# Patient Record
Sex: Female | Born: 1965 | Race: White | Hispanic: No | Marital: Single | Smoking: Never smoker
Health system: Southern US, Community
[De-identification: ages and names within clinical notes are randomized; demographics above are authoritative.]

## PROBLEM LIST (undated history)

## (undated) HISTORY — PX: NASAL SINUS SURGERY: SHX719

## (undated) HISTORY — PX: ENDOMETRIAL ABLATION: SHX621

---

## 1997-08-21 ENCOUNTER — Other Ambulatory Visit: Admission: RE | Admit: 1997-08-21 | Discharge: 1997-08-21 | Payer: Self-pay | Admitting: Obstetrics and Gynecology

## 1997-10-15 ENCOUNTER — Ambulatory Visit (HOSPITAL_COMMUNITY): Admission: RE | Admit: 1997-10-15 | Discharge: 1997-10-15 | Payer: Self-pay | Admitting: Obstetrics and Gynecology

## 1999-03-10 ENCOUNTER — Other Ambulatory Visit: Admission: RE | Admit: 1999-03-10 | Discharge: 1999-03-10 | Payer: Self-pay | Admitting: *Deleted

## 1999-03-11 ENCOUNTER — Encounter (INDEPENDENT_AMBULATORY_CARE_PROVIDER_SITE_OTHER): Payer: Self-pay | Admitting: Specialist

## 1999-03-11 ENCOUNTER — Other Ambulatory Visit: Admission: RE | Admit: 1999-03-11 | Discharge: 1999-03-11 | Payer: Self-pay | Admitting: *Deleted

## 2000-03-09 ENCOUNTER — Other Ambulatory Visit: Admission: RE | Admit: 2000-03-09 | Discharge: 2000-03-09 | Payer: Self-pay | Admitting: Obstetrics and Gynecology

## 2000-05-10 ENCOUNTER — Encounter: Admission: RE | Admit: 2000-05-10 | Discharge: 2000-05-10 | Payer: Self-pay | Admitting: Obstetrics and Gynecology

## 2000-05-10 ENCOUNTER — Encounter: Payer: Self-pay | Admitting: Obstetrics and Gynecology

## 2001-04-19 ENCOUNTER — Other Ambulatory Visit: Admission: RE | Admit: 2001-04-19 | Discharge: 2001-04-19 | Payer: Self-pay | Admitting: Obstetrics and Gynecology

## 2002-04-26 ENCOUNTER — Other Ambulatory Visit: Admission: RE | Admit: 2002-04-26 | Discharge: 2002-04-26 | Payer: Self-pay | Admitting: Obstetrics and Gynecology

## 2003-05-21 ENCOUNTER — Other Ambulatory Visit: Admission: RE | Admit: 2003-05-21 | Discharge: 2003-05-21 | Payer: Self-pay | Admitting: Obstetrics and Gynecology

## 2005-10-20 ENCOUNTER — Encounter: Admission: RE | Admit: 2005-10-20 | Discharge: 2005-10-20 | Payer: Self-pay | Admitting: Obstetrics and Gynecology

## 2009-06-25 ENCOUNTER — Ambulatory Visit: Payer: Self-pay | Admitting: Internal Medicine

## 2010-02-21 ENCOUNTER — Ambulatory Visit (HOSPITAL_COMMUNITY): Admission: RE | Admit: 2010-02-21 | Discharge: 2010-02-21 | Payer: Self-pay | Admitting: Obstetrics and Gynecology

## 2010-09-02 ENCOUNTER — Ambulatory Visit: Payer: Self-pay | Admitting: Family Medicine

## 2010-09-26 LAB — CBC
HCT: 34.4 % — ABNORMAL LOW (ref 36.0–46.0)
Hemoglobin: 11.3 g/dL — ABNORMAL LOW (ref 12.0–15.0)
MCH: 23.3 pg — ABNORMAL LOW (ref 26.0–34.0)
MCHC: 32.7 g/dL (ref 30.0–36.0)
MCV: 71.3 fL — ABNORMAL LOW (ref 78.0–100.0)
Platelets: 338 10*3/uL (ref 150–400)
RBC: 4.83 MIL/uL (ref 3.87–5.11)
RDW: 15.3 % (ref 11.5–15.5)
WBC: 10.1 10*3/uL (ref 4.0–10.5)

## 2010-09-26 LAB — HCG, SERUM, QUALITATIVE: Preg, Serum: NEGATIVE

## 2011-08-19 ENCOUNTER — Ambulatory Visit: Payer: Self-pay | Admitting: Otolaryngology

## 2012-04-19 ENCOUNTER — Other Ambulatory Visit: Payer: Self-pay | Admitting: Obstetrics and Gynecology

## 2012-04-23 ENCOUNTER — Encounter (HOSPITAL_COMMUNITY): Payer: Self-pay | Admitting: Pharmacist

## 2012-04-25 ENCOUNTER — Encounter (HOSPITAL_COMMUNITY): Payer: Self-pay | Admitting: *Deleted

## 2012-04-27 NOTE — H&P (Signed)
[name], [name]             ACCOUNT NO.:  [id number]  MEDICAL RECORD NO.:  [id number]  LOCATION:  PERIO                         FACILITY:  [facility]  PHYSICIAN:  Lenoard Aden, M.D.DATE OF BIRTH:  [birth year]  DATE OF ADMISSION:  04/19/2012 DATE OF DISCHARGE:                             HISTORY & PHYSICAL   CHIEF COMPLAINT:  Symptomatic menorrhagia with a history of failed NovaSure.  HISTORY OF PRESENT ILLNESS:  She is a 46 year old white female, G3, P2, with a history of Adiana and NovaSure placed in 2011 who now presents with persistent menorrhagia and pelvic pain for definitive therapy.  MEDICATIONS:  Vicodin prn for pain and Micronor for bleeding.  SOCIAL HISTORY:  She is a nonsmoker, nondrinker.  She denies domestic or physical violence.  SURGICAL HISTORY:  Remarkable for Adiana in 2011, NovaSure ablation in 2011, LEEP in 1997, D and C in 1990, tonsillectomy in 1984, and 2 vaginal deliveries.  FAMILY HISTORY:  Breast cancer, ovarian cancer, and chronic hypertension.  PHYSICAL EXAMINATION:  GENERAL:  She is a well-developed, well- nourished, white female in no acute distress. HEENT:  Normal. NECK:  Supple.  Full range of motion. LUNGS:  Clear. HEART:  Regular rhythm. ABDOMEN:  Soft, nontender.  Pelvic exam revealed a bulky anteflexed uterus.  No adnexal masses. EXTREMITIES:  No cords. NEUROLOGIC:  Nonfocal. SKIN:  Intact.  IMPRESSION:  Refractory dysmenorrhea and menorrhagia.  The patient is status post failed NovaSure.  Plans to proceed with da Vinci assisted total laparoscopic hysterectomy, bilateral salpingectomy.  Risks of anesthesia, infection, bleeding, injury to abdominal organs With need to repair was discussed.  Delayed versus immediate complications to include bowel and bladder injury noted.  The patient acknowledges and wishes to proceed.    Lenoard Aden, M.D.    RJT/MEDQ  D:  04/27/2012  T:  04/27/2012  Job:  [id number]

## 2012-04-28 ENCOUNTER — Ambulatory Visit (HOSPITAL_COMMUNITY): Payer: Managed Care, Other (non HMO) | Admitting: Anesthesiology

## 2012-04-28 ENCOUNTER — Encounter (HOSPITAL_COMMUNITY): Payer: Self-pay | Admitting: Anesthesiology

## 2012-04-28 ENCOUNTER — Ambulatory Visit (HOSPITAL_COMMUNITY)
Admission: RE | Admit: 2012-04-28 | Discharge: 2012-04-29 | Disposition: A | Discharge status: Home / Self Care | Payer: Managed Care, Other (non HMO) | Source: Ambulatory Visit | Attending: Obstetrics and Gynecology | Admitting: Obstetrics and Gynecology

## 2012-04-28 ENCOUNTER — Encounter (HOSPITAL_COMMUNITY)
Admission: RE | Disposition: A | Discharge status: Home / Self Care | Payer: Self-pay | Source: Ambulatory Visit | Attending: Obstetrics and Gynecology

## 2012-04-28 DIAGNOSIS — N920 Excessive and frequent menstruation with regular cycle: Secondary | ICD-10-CM | POA: Insufficient documentation

## 2012-04-28 DIAGNOSIS — N803 Endometriosis of pelvic peritoneum, unspecified: Secondary | ICD-10-CM | POA: Insufficient documentation

## 2012-04-28 LAB — CBC
HCT: 38.9 % (ref 36.0–46.0)
MCV: 82.2 fL (ref 78.0–100.0)
Platelets: 339 10*3/uL (ref 150–400)
RBC: 4.73 MIL/uL (ref 3.87–5.11)
RDW: 12.3 % (ref 11.5–15.5)
WBC: 8.6 10*3/uL (ref 4.0–10.5)

## 2012-04-28 LAB — SURGICAL PCR SCREEN: Staphylococcus aureus: POSITIVE — AB

## 2012-04-28 SURGERY — ROBOTIC ASSISTED TOTAL HYSTERECTOMY
Anesthesia: General | Site: Abdomen | Wound class: Clean Contaminated

## 2012-04-28 MED ORDER — SODIUM CHLORIDE 0.9 % IJ SOLN: 9.0000 mL | INTRAMUSCULAR | Status: DC | PRN

## 2012-04-28 MED ORDER — DEXAMETHASONE SODIUM PHOSPHATE 10 MG/ML IJ SOLN
INTRAMUSCULAR | Status: DC | PRN
  Administered 2012-04-28: 10 mg via INTRAVENOUS

## 2012-04-28 MED ORDER — ZOLPIDEM TARTRATE 5 MG PO TABS: 5.0000 mg | ORAL_TABLET | Freq: Every evening | ORAL | Status: DC | PRN

## 2012-04-28 MED ORDER — DEXAMETHASONE SODIUM PHOSPHATE 10 MG/ML IJ SOLN
INTRAMUSCULAR | Status: AC
  Filled 2012-04-28: qty 1

## 2012-04-28 MED ORDER — FENTANYL CITRATE 0.05 MG/ML IJ SOLN
INTRAMUSCULAR | Status: DC | PRN
  Administered 2012-04-28: 100 ug via INTRAVENOUS
  Administered 2012-04-28 (×2): 50 ug via INTRAVENOUS
  Administered 2012-04-28: 100 ug via INTRAVENOUS
  Administered 2012-04-28: 50 ug via INTRAVENOUS

## 2012-04-28 MED ORDER — MIDAZOLAM HCL 2 MG/2ML IJ SOLN
INTRAMUSCULAR | Status: AC
  Filled 2012-04-28: qty 2

## 2012-04-28 MED ORDER — ACETAMINOPHEN 10 MG/ML IV SOLN
INTRAVENOUS | Status: DC | PRN
  Administered 2012-04-28: 1000 mg via INTRAVENOUS

## 2012-04-28 MED ORDER — MUPIROCIN 2 % EX OINT
TOPICAL_OINTMENT | Freq: Two times a day (BID) | CUTANEOUS | Status: DC
  Administered 2012-04-28: 21:00:00 via NASAL

## 2012-04-28 MED ORDER — PHENYLEPHRINE HCL 10 MG/ML IJ SOLN
INTRAMUSCULAR | Status: DC | PRN
  Administered 2012-04-28: 40 ug via INTRAVENOUS

## 2012-04-28 MED ORDER — ONDANSETRON HCL 4 MG/2ML IJ SOLN
INTRAMUSCULAR | Status: AC
  Filled 2012-04-28: qty 2

## 2012-04-28 MED ORDER — ONDANSETRON HCL 4 MG/2ML IJ SOLN
4.0000 mg | Freq: Once | INTRAMUSCULAR | Status: AC | PRN
  Administered 2012-04-28: 4 mg via INTRAVENOUS

## 2012-04-28 MED ORDER — BUPIVACAINE HCL (PF) 0.25 % IJ SOLN
INTRAMUSCULAR | Status: AC
  Filled 2012-04-28: qty 30

## 2012-04-28 MED ORDER — TRAMADOL HCL 50 MG PO TABS
50.0000 mg | ORAL_TABLET | Freq: Four times a day (QID) | ORAL | Status: DC | PRN
  Administered 2012-04-29: 50 mg via ORAL
  Filled 2012-04-28: qty 1

## 2012-04-28 MED ORDER — ROCURONIUM BROMIDE 100 MG/10ML IV SOLN
INTRAVENOUS | Status: DC | PRN
  Administered 2012-04-28 (×2): 10 mg via INTRAVENOUS
  Administered 2012-04-28: 40 mg via INTRAVENOUS

## 2012-04-28 MED ORDER — FENTANYL CITRATE 0.05 MG/ML IJ SOLN
INTRAMUSCULAR | Status: AC
  Filled 2012-04-28: qty 5

## 2012-04-28 MED ORDER — SUCCINYLCHOLINE CHLORIDE 20 MG/ML IJ SOLN: INTRAMUSCULAR | Status: DC | PRN

## 2012-04-28 MED ORDER — NEOSTIGMINE METHYLSULFATE 1 MG/ML IJ SOLN
INTRAMUSCULAR | Status: AC
  Filled 2012-04-28: qty 10

## 2012-04-28 MED ORDER — OXYCODONE-ACETAMINOPHEN 5-325 MG PO TABS
1.0000 | ORAL_TABLET | ORAL | Status: DC | PRN
  Administered 2012-04-29 (×2): 1 via ORAL
  Filled 2012-04-28 (×2): qty 1

## 2012-04-28 MED ORDER — MEPERIDINE HCL 25 MG/ML IJ SOLN: 6.2500 mg | INTRAMUSCULAR | Status: DC | PRN

## 2012-04-28 MED ORDER — ONDANSETRON HCL 4 MG/2ML IJ SOLN
INTRAMUSCULAR | Status: AC
  Administered 2012-04-28: 4 mg via INTRAVENOUS
  Filled 2012-04-28: qty 2

## 2012-04-28 MED ORDER — DIPHENHYDRAMINE HCL 12.5 MG/5ML PO ELIX: 12.5000 mg | ORAL_SOLUTION | Freq: Four times a day (QID) | ORAL | Status: DC | PRN

## 2012-04-28 MED ORDER — LIDOCAINE HCL (CARDIAC) 20 MG/ML IV SOLN
INTRAVENOUS | Status: AC
  Filled 2012-04-28: qty 5

## 2012-04-28 MED ORDER — ROCURONIUM BROMIDE 50 MG/5ML IV SOLN
INTRAVENOUS | Status: AC
  Filled 2012-04-28: qty 2

## 2012-04-28 MED ORDER — PHENYLEPHRINE 40 MCG/ML (10ML) SYRINGE FOR IV PUSH (FOR BLOOD PRESSURE SUPPORT)
PREFILLED_SYRINGE | INTRAVENOUS | Status: AC
  Filled 2012-04-28: qty 5

## 2012-04-28 MED ORDER — DIPHENHYDRAMINE HCL 50 MG/ML IJ SOLN: 12.5000 mg | Freq: Four times a day (QID) | INTRAMUSCULAR | Status: DC | PRN

## 2012-04-28 MED ORDER — ACETAMINOPHEN 10 MG/ML IV SOLN
1000.0000 mg | Freq: Once | INTRAVENOUS | Status: DC | PRN
  Filled 2012-04-28: qty 100

## 2012-04-28 MED ORDER — GLYCOPYRROLATE 0.2 MG/ML IJ SOLN
INTRAMUSCULAR | Status: AC
  Filled 2012-04-28: qty 4

## 2012-04-28 MED ORDER — ONDANSETRON HCL 4 MG/2ML IJ SOLN
INTRAMUSCULAR | Status: DC | PRN
  Administered 2012-04-28: 4 mg via INTRAVENOUS

## 2012-04-28 MED ORDER — ONDANSETRON HCL 4 MG/2ML IJ SOLN: 4.0000 mg | Freq: Four times a day (QID) | INTRAMUSCULAR | Status: DC | PRN

## 2012-04-28 MED ORDER — NALOXONE HCL 0.4 MG/ML IJ SOLN: 0.4000 mg | INTRAMUSCULAR | Status: DC | PRN

## 2012-04-28 MED ORDER — EPHEDRINE SULFATE 50 MG/ML IJ SOLN
INTRAMUSCULAR | Status: DC | PRN
  Administered 2012-04-28: 5 mg via INTRAVENOUS

## 2012-04-28 MED ORDER — HYDROMORPHONE 0.3 MG/ML IV SOLN
INTRAVENOUS | Status: DC
  Administered 2012-04-28: 1.19 mg via INTRAVENOUS
  Administered 2012-04-28: 15:00:00 via INTRAVENOUS
  Administered 2012-04-28: 3.59 mg via INTRAVENOUS
  Filled 2012-04-28: qty 25

## 2012-04-28 MED ORDER — DEXTROSE IN LACTATED RINGERS 5 % IV SOLN
INTRAVENOUS | Status: DC
  Administered 2012-04-28: 21:00:00 via INTRAVENOUS

## 2012-04-28 MED ORDER — CEFAZOLIN SODIUM-DEXTROSE 2-3 GM-% IV SOLR
INTRAVENOUS | Status: AC
  Filled 2012-04-28: qty 50

## 2012-04-28 MED ORDER — SUCCINYLCHOLINE CHLORIDE 20 MG/ML IJ SOLN
INTRAMUSCULAR | Status: AC
  Filled 2012-04-28: qty 10

## 2012-04-28 MED ORDER — PROPOFOL 10 MG/ML IV EMUL
INTRAVENOUS | Status: DC | PRN
  Administered 2012-04-28: 30 mg via INTRAVENOUS
  Administered 2012-04-28: 170 mg via INTRAVENOUS

## 2012-04-28 MED ORDER — MIDAZOLAM HCL 5 MG/5ML IJ SOLN
INTRAMUSCULAR | Status: DC | PRN
  Administered 2012-04-28: 2 mg via INTRAVENOUS

## 2012-04-28 MED ORDER — LIDOCAINE HCL (CARDIAC) 20 MG/ML IV SOLN
INTRAVENOUS | Status: DC | PRN
  Administered 2012-04-28: 60 mg via INTRAVENOUS

## 2012-04-28 MED ORDER — ARTIFICIAL TEARS OP OINT
TOPICAL_OINTMENT | OPHTHALMIC | Status: DC | PRN
  Administered 2012-04-28: 1 via OPHTHALMIC

## 2012-04-28 MED ORDER — GLYCOPYRROLATE 0.2 MG/ML IJ SOLN
INTRAMUSCULAR | Status: DC | PRN
  Administered 2012-04-28: 0.1 mg via INTRAVENOUS
  Administered 2012-04-28: .8 mg via INTRAVENOUS

## 2012-04-28 MED ORDER — PROPOFOL 10 MG/ML IV EMUL
INTRAVENOUS | Status: AC
  Filled 2012-04-28: qty 20

## 2012-04-28 MED ORDER — NEOSTIGMINE METHYLSULFATE 1 MG/ML IJ SOLN
INTRAMUSCULAR | Status: DC | PRN
  Administered 2012-04-28: 5 mg via INTRAVENOUS

## 2012-04-28 MED ORDER — FENTANYL CITRATE 0.05 MG/ML IJ SOLN
INTRAMUSCULAR | Status: AC
  Filled 2012-04-28: qty 2

## 2012-04-28 MED ORDER — FENTANYL CITRATE 0.05 MG/ML IJ SOLN
25.0000 ug | INTRAMUSCULAR | Status: DC | PRN
  Administered 2012-04-28: 25 ug via INTRAVENOUS
  Administered 2012-04-28: 50 ug via INTRAVENOUS

## 2012-04-28 MED ORDER — ACETAMINOPHEN 10 MG/ML IV SOLN: 1000.0000 mg | Freq: Four times a day (QID) | INTRAVENOUS | Status: DC

## 2012-04-28 MED ORDER — CEFAZOLIN SODIUM-DEXTROSE 2-3 GM-% IV SOLR
2.0000 g | INTRAVENOUS | Status: AC
  Administered 2012-04-28: 2 g via INTRAVENOUS

## 2012-04-28 MED ORDER — LIDOCAINE HCL (CARDIAC) 20 MG/ML IV SOLN: INTRAVENOUS | Status: DC | PRN

## 2012-04-28 MED ORDER — LACTATED RINGERS IV SOLN
INTRAVENOUS | Status: DC
  Administered 2012-04-28 (×2): via INTRAVENOUS

## 2012-04-28 MED ORDER — BUPIVACAINE HCL (PF) 0.25 % IJ SOLN
INTRAMUSCULAR | Status: DC | PRN
  Administered 2012-04-28: 30 mL

## 2012-04-28 MED ORDER — SCOPOLAMINE 1 MG/3DAYS TD PT72
1.0000 | MEDICATED_PATCH | TRANSDERMAL | Status: DC
  Administered 2012-04-28: 1.5 mg via TRANSDERMAL

## 2012-04-28 MED ORDER — FENTANYL CITRATE 0.05 MG/ML IJ SOLN
INTRAMUSCULAR | Status: AC
  Administered 2012-04-28: 25 ug via INTRAVENOUS
  Filled 2012-04-28: qty 2

## 2012-04-28 MED ORDER — MUPIROCIN 2 % EX OINT
TOPICAL_OINTMENT | CUTANEOUS | Status: AC
  Filled 2012-04-28: qty 22

## 2012-04-28 SURGICAL SUPPLY — 67 items
ADH SKN CLS APL DERMABOND .7 (GAUZE/BANDAGES/DRESSINGS) ×2
BAG URINE DRAINAGE (UROLOGICAL SUPPLIES) ×3 IMPLANT
BARRIER ADHS 3X4 INTERCEED (GAUZE/BANDAGES/DRESSINGS) ×3 IMPLANT
BRR ADH 4X3 ABS CNTRL BYND (GAUZE/BANDAGES/DRESSINGS) ×2
CABLE HIGH FREQUENCY MONO STRZ (ELECTRODE) ×3 IMPLANT
CATH FOLEY 3WAY  5CC 16FR (CATHETERS) ×1
CATH FOLEY 3WAY 5CC 16FR (CATHETERS) ×2 IMPLANT
CHLORAPREP W/TINT 26ML (MISCELLANEOUS) ×3 IMPLANT
CLOTH BEACON ORANGE TIMEOUT ST (SAFETY) ×3 IMPLANT
CONT PATH 16OZ SNAP LID 3702 (MISCELLANEOUS) ×3 IMPLANT
COVER MAYO STAND STRL (DRAPES) ×3 IMPLANT
COVER TABLE BACK 60X90 (DRAPES) ×6 IMPLANT
COVER TIP SHEARS 8 DVNC (MISCELLANEOUS) ×2 IMPLANT
COVER TIP SHEARS 8MM DA VINCI (MISCELLANEOUS) ×1
DECANTER SPIKE VIAL GLASS SM (MISCELLANEOUS) ×3 IMPLANT
DERMABOND ADVANCED (GAUZE/BANDAGES/DRESSINGS) ×1
DERMABOND ADVANCED .7 DNX12 (GAUZE/BANDAGES/DRESSINGS) ×2 IMPLANT
DILATOR CANAL MILEX (MISCELLANEOUS) ×1 IMPLANT
DRAPE HUG U DISPOSABLE (DRAPE) ×3 IMPLANT
DRAPE LG THREE QUARTER DISP (DRAPES) ×6 IMPLANT
DRAPE MONITOR DA VINCI (DRAPE) IMPLANT
DRAPE WARM FLUID 44X44 (DRAPE) ×3 IMPLANT
ELECT REM PT RETURN 9FT ADLT (ELECTROSURGICAL) ×3
ELECTRODE REM PT RTRN 9FT ADLT (ELECTROSURGICAL) ×2 IMPLANT
EVACUATOR SMOKE 8.L (FILTER) ×4 IMPLANT
GAUZE VASELINE 3X9 (GAUZE/BANDAGES/DRESSINGS) IMPLANT
GLOVE BIO SURGEON STRL SZ7.5 (GLOVE) ×9 IMPLANT
GOWN STRL REIN XL XLG (GOWN DISPOSABLE) ×18 IMPLANT
GYRUS RUMI II 2.5CM BLUE (DISPOSABLE)
KIT ACCESSORY DA VINCI DISP (KITS) ×1
KIT ACCESSORY DVNC DISP (KITS) ×2 IMPLANT
KIT DISP ACCESSORY 4 ARM (KITS) IMPLANT
NDL INSUFFLATION 14GA 120MM (NEEDLE) ×2 IMPLANT
NEEDLE INSUFFLATION 14GA 120MM (NEEDLE) ×3 IMPLANT
PACK LAVH (CUSTOM PROCEDURE TRAY) ×3 IMPLANT
PAD PREP 24X48 CUFFED NSTRL (MISCELLANEOUS) ×6 IMPLANT
PLUG CATH AND CAP STER (CATHETERS) ×3 IMPLANT
PROTECTOR NERVE ULNAR (MISCELLANEOUS) ×6 IMPLANT
RUMI II 3.0CM BLUE KOH-EFFICIE (DISPOSABLE) ×2 IMPLANT
RUMI II GYRUS 2.5CM BLUE (DISPOSABLE) IMPLANT
SET CYSTO W/LG BORE CLAMP LF (SET/KITS/TRAYS/PACK) IMPLANT
SET IRRIG TUBING LAPAROSCOPIC (IRRIGATION / IRRIGATOR) ×3 IMPLANT
SOLUTION ELECTROLUBE (MISCELLANEOUS) ×3 IMPLANT
SUT VIC AB 0 CT1 27 (SUTURE) ×6
SUT VIC AB 0 CT1 27XBRD ANBCTR (SUTURE) ×4 IMPLANT
SUT VIC AB 0 CT1 27XBRD ANTBC (SUTURE) IMPLANT
SUT VICRYL 0 UR6 27IN ABS (SUTURE) ×3 IMPLANT
SUT VICRYL RAPIDE 4/0 PS 2 (SUTURE) ×6 IMPLANT
SUT VLOC 180 0 9IN  GS21 (SUTURE) ×1
SUT VLOC 180 0 9IN GS21 (SUTURE) IMPLANT
SYR 50ML LL SCALE MARK (SYRINGE) ×3 IMPLANT
SYRINGE 10CC LL (SYRINGE) ×3 IMPLANT
SYSTEM CONVERTIBLE TROCAR (TROCAR) IMPLANT
TIP UTERINE 5.1X6CM LAV DISP (MISCELLANEOUS) IMPLANT
TIP UTERINE 6.7X10CM GRN DISP (MISCELLANEOUS) IMPLANT
TIP UTERINE 6.7X6CM WHT DISP (MISCELLANEOUS) IMPLANT
TIP UTERINE 6.7X8CM BLUE DISP (MISCELLANEOUS) ×1 IMPLANT
TOWEL OR 17X24 6PK STRL BLUE (TOWEL DISPOSABLE) ×9 IMPLANT
TROCAR DISP BLADELESS 8 DVNC (TROCAR) ×2 IMPLANT
TROCAR DISP BLADELESS 8MM (TROCAR) ×1
TROCAR XCEL 12X100 BLDLESS (ENDOMECHANICALS) IMPLANT
TROCAR XCEL NON-BLD 5MMX100MML (ENDOMECHANICALS) ×3 IMPLANT
TROCAR Z-THREAD 12X150 (TROCAR) ×3 IMPLANT
TROCAR Z-THREAD FIOS 12X100MM (TROCAR) IMPLANT
TUBING FILTER THERMOFLATOR (ELECTROSURGICAL) ×3 IMPLANT
WARMER LAPAROSCOPE (MISCELLANEOUS) ×3 IMPLANT
WATER STERILE IRR 1000ML POUR (IV SOLUTION) ×9 IMPLANT

## 2012-04-28 NOTE — Addendum Note (Signed)
Addendum  created 04/28/12 1720 by Elbert Ewings, CRNA   Modules edited:Notes Section

## 2012-04-28 NOTE — Op Note (Signed)
04/28/2012  12:52 PM  PATIENT:  [name]  46 y.o. female  PRE-OPERATIVE DIAGNOSIS:  Menorrhagia  POST-OPERATIVE DIAGNOSIS:  Menorrhagia Pelvic endometriosis Pelvic adhesions  PROCEDURE:  Procedure(s): ROBOTIC ASSISTED TOTAL HYSTERECTOMY BILATERAL SALPINGECTOMY Lysis of Adhesions Ablation of cul-de - sac endometriosis Cul-de -plasty  SURGEON:  Surgeon(s): Lenoard Aden, MD  ASSISTANTS: Fredric Mare, CNM   ANESTHESIA:   local and general  ESTIMATED BLOOD LOSS: * No blood loss amount entered *   DRAINS: Urinary Catheter (Foley)   LOCAL MEDICATIONS USED:  MARCAINE     SPECIMEN:  Source of Specimen:  uterus , cervix and bilateral tubes  DISPOSITION OF SPECIMEN:  PATHOLOGY  COUNTS:  YES  DICTATION #: [id number]  PLAN OF CARE: DC home  PATIENT DISPOSITION:  PACU - hemodynamically stable.

## 2012-04-28 NOTE — Progress Notes (Signed)
Patient ID: [name], female   DOB: [birth year], 46 y.o.   MRN: [id number] Patient seen and examined. Consent witnessed and signed. No changes noted. Update completed.

## 2012-04-28 NOTE — Transfer of Care (Signed)
Immediate Anesthesia Transfer of Care Note  Patient: [name]  Procedure(s) Performed: Procedure(s) (LRB) with comments: ROBOTIC ASSISTED TOTAL HYSTERECTOMY (N/A) BILATERAL SALPINGECTOMY (Bilateral)  Patient Location: PACU  Anesthesia Type: General  Level of Consciousness: awake, alert  and oriented  Airway & Oxygen Therapy: Patient Spontanous Breathing and Patient connected to nasal cannula oxygen  Post-op Assessment: Report given to PACU RN and Post -op Vital signs reviewed and stable  Post vital signs: Reviewed and stable  Complications: No apparent anesthesia complications

## 2012-04-28 NOTE — Anesthesia Preprocedure Evaluation (Signed)
Anesthesia Evaluation  Patient identified by MRN, date of birth, ID band Patient awake    Reviewed: Allergy & Precautions, H&P , NPO status , Patient's Chart, lab work & pertinent test results  Airway Mallampati: II TM Distance: >3 FB Neck ROM: full    Dental  (+) Teeth Intact   Pulmonary neg pulmonary ROS,  breath sounds clear to auscultation  Pulmonary exam normal       Cardiovascular negative cardio ROS      Neuro/Psych negative neurological ROS  negative psych ROS   GI/Hepatic negative GI ROS, Neg liver ROS,   Endo/Other  negative endocrine ROS  Renal/GU negative Renal ROS  negative genitourinary   Musculoskeletal negative musculoskeletal ROS (+)   Abdominal Normal abdominal exam  (+)   Peds negative pediatric ROS (+)  Hematology negative hematology ROS (+)   Anesthesia Other Findings   Reproductive/Obstetrics negative OB ROS                           Anesthesia Physical Anesthesia Plan  ASA: II  Anesthesia Plan: General   Post-op Pain Management:    Induction: Intravenous  Airway Management Planned: Oral ETT  Additional Equipment:   Intra-op Plan:   Post-operative Plan: Extubation in OR  Informed Consent: I have reviewed the patients History and Physical, chart, labs and discussed the procedure including the risks, benefits and alternatives for the proposed anesthesia with the patient or authorized representative who has indicated his/her understanding and acceptance.   Dental Advisory Given  Plan Discussed with: CRNA and Surgeon  Anesthesia Plan Comments:         Anesthesia Quick Evaluation

## 2012-04-28 NOTE — Anesthesia Procedure Notes (Signed)
Procedure Name: Intubation Date/Time: 04/28/2012 11:04 AM Performed by: Graciela Husbands Pre-anesthesia Checklist: Suction available, Emergency Drugs available, Timeout performed, Patient identified and Patient being monitored Patient Re-evaluated:Patient Re-evaluated prior to inductionOxygen Delivery Method: Circle system utilized Preoxygenation: Pre-oxygenation with 100% oxygen Intubation Type: IV induction Ventilation: Oral airway inserted - appropriate to patient size and Mask ventilation without difficulty Laryngoscope Size: Mac and 3 Grade View: Grade I Tube type: Oral Tube size: 7.0 mm Number of attempts: 1 Airway Equipment and Method: Patient positioned with wedge pillow and Stylet (cricoid pressue applied to view cords) Placement Confirmation: ETT inserted through vocal cords under direct vision,  positive ETCO2 and breath sounds checked- equal and bilateral Secured at: 22 cm Tube secured with: Tape Dental Injury: Teeth and Oropharynx as per pre-operative assessment

## 2012-04-28 NOTE — Anesthesia Postprocedure Evaluation (Signed)
  Anesthesia Post-op Note  Patient: [name]  Procedure(s) Performed: Procedure(s) (LRB) with comments: ROBOTIC ASSISTED TOTAL HYSTERECTOMY (N/A) BILATERAL SALPINGECTOMY (Bilateral)  Patient Location: PACU  Anesthesia Type: General  Level of Consciousness: awake, alert  and oriented  Airway and Oxygen Therapy: Patient Spontanous Breathing  Post-op Pain: none  Post-op Assessment: Post-op Vital signs reviewed, Patient's Cardiovascular Status Stable, Respiratory Function Stable, Patent Airway, No signs of Nausea or vomiting and Pain level controlled  Post-op Vital Signs: Reviewed and stable  Complications: No apparent anesthesia complications

## 2012-04-28 NOTE — Anesthesia Postprocedure Evaluation (Signed)
  Anesthesia Post-op Note  Patient: [name]  Procedure(s) Performed: Procedure(s) (LRB) with comments: ROBOTIC ASSISTED TOTAL HYSTERECTOMY (N/A) BILATERAL SALPINGECTOMY (Bilateral)  Patient Location: PACU and Women's Unit  Anesthesia Type: General  Level of Consciousness: awake, alert  and oriented  Airway and Oxygen Therapy: Patient Spontanous Breathing  Post-op Pain: mild  Post-op Assessment: Post-op Vital signs reviewed, Respiratory Function Stable, No signs of Nausea or vomiting, Adequate PO intake and Pain level controlled  Post-op Vital Signs: stable  Complications: No apparent anesthesia complications

## 2012-04-29 LAB — CBC
Hemoglobin: 11.4 g/dL — ABNORMAL LOW (ref 12.0–15.0)
MCH: 26.4 pg (ref 26.0–34.0)
MCV: 83.1 fL (ref 78.0–100.0)
RBC: 4.32 MIL/uL (ref 3.87–5.11)
WBC: 17.6 10*3/uL — ABNORMAL HIGH (ref 4.0–10.5)

## 2012-04-29 MED ORDER — OXYCODONE-ACETAMINOPHEN 5-325 MG PO TABS: 1.0000 | ORAL_TABLET | ORAL | Status: DC | PRN

## 2012-04-29 MED ORDER — TRAMADOL HCL 50 MG PO TABS: 50.0000 mg | ORAL_TABLET | Freq: Four times a day (QID) | ORAL | Status: DC | PRN

## 2012-04-29 NOTE — Op Note (Signed)
[name], [name]             ACCOUNT NO.:  [id number]  MEDICAL RECORD NO.:  [id number]  LOCATION:  9302                          FACILITY:  [facility]  PHYSICIAN:  Lenoard Aden, M.D.DATE OF BIRTH:  Jan 30, 1966  DATE OF PROCEDURE:  04/28/2012 DATE OF DISCHARGE:                              OPERATIVE REPORT   DESCRIPTION OF PROCEDURE:  On April 28, 2012 after being apprised of the risks of anesthesia, infection, bleeding, injury to abdominal organs with possible need for repair, delayed versus immediate complications to include bowel and bladder injury, possible need for repair, the patient was brought to the operating room where she was administered a general anesthetic without complications.  Prepped and draped in sterile fashion.  Feet were placed in the Yellofin stirrups.  Foley catheter placed.  RUMI retractor placed per vagina in standard fashion.  At this time, infraumbilical incision made with a scalpel.  Veress needle placed, opening pressure of 2 was noted, 5 L of CO2 insufflated without difficulty.  Atraumatic placement of a 12 mm trocar.  At this time, visualization revealed atraumatic trocar entry.  Normal liver, gallbladder, bladder appendix not seen.  There is extensive scarring in the cul-de-sac from pelvic endometriosis and also evidence of vesicular endometriosis on the uterine surface.  The anterior cul-de-sac was cleaned.  Both tubes appeared normal and both ovaries appeared normal. At this time, 2 robotic ports were placed on the right and left respectively, 8 mm ports and a 5 mm additional assistant port on the left.  The robot was then docked in a standard fashion.  The PK forceps and Endo Shears were placed.  The robot was docked after achieving steep Trendelenburg position.  At this time, the ureters were identified bilaterally.  The mesosalpinx on the left was divided sharply using electrocautery and the tube was detached at its medial most aspect.   The retroperitoneal space was entered.  The ovarian ligament was then cauterized and divided.  The ureter was dissected sharply off the medial leaf of the peritoneum.  The uterine vessels skeletonized posteriorly. The brown ligament was divided.  The bladder flap was developed sharply after scoring along the RUMI cup.  After developing the bladder flap, the uterine vessels were further skeletonized on the left, cauterized and not divided.  On the right side, the ureter was identified.  The right tube was traced along the mesosalpinx and divided and retracted in the midline.  At this time, the retroperitoneal space was entered.  The ovarian ligament was cauterized and divided.  The ureter was dissected sharply off the medial leaf of the peritoneum.  The uterine vessels skeletonized posteriorly.  The round ligament was identified, divided, and the bladder flap was further developed.  Further skeletonization of the uterine vessels on the right was achieved and the vessels were cauterized and divided on the right.  The occluder balloon was then inflated and the RUMI cup was identified 360 degrees and the specimen was detached circumferentially using monopolar cautery.  Good hemostasis was noted.  The specimen was retracted vaginally and sent to Pathology for permanent section.  Endometriosis along the posterior cul-de-sac was identified and ablated using the Endo Shears along the left  portion after identification of the course of the ureter.  Good hemostasis was noted.  Urine was clear.  Ureters were peristalsing bilaterally and normally.  The vaginal cuff was closed using a 0 V-Loc suture in a continuous running fashion with the second imbricating layer placed and a McCall culdoplasty suture was extended using the suture.  Good hemostasis was noted.  The needle was removed.  Irrigation was accomplished.  At this time, all instruments were removed from the abdomen after the robot was  undocked.  Good hemostasis was reassured. CO2 was released.  All trocars were removed under direct visualization. Incisions were closed using 0 Vicryl, 4-0 Vicryl, and Dermabond.  Urine was clear and copious.  The vaginal exam reveals a well-approximated vaginal cuff.  The patient tolerated the procedure well and was transferred to recovery in good condition.     Lenoard Aden, M.D.     RJT/MEDQ  D:  04/28/2012  T:  04/29/2012  Job:  [id number]

## 2012-04-29 NOTE — Progress Notes (Signed)
1 Day Post-Op Procedure(s) (LRB): ROBOTIC ASSISTED TOTAL HYSTERECTOMY (N/A) BILATERAL SALPINGECTOMY (Bilateral)  Subjective: Patient reports nausea, incisional pain, tolerating PO, + flatus and no problems voiding.    Objective: I have reviewed patient's vital signs, intake and output, medications and labs. BP 122/70  Pulse 67  Temp 97.7 F (36.5 C) (Oral)  Resp 18  Ht 5\' 6"  (1.676 m)  Wt 90.81 kg (200 lb 3.2 oz)  BMI 32.31 kg/m2  SpO2 98%  LMP 04/28/2012   General: alert, cooperative and appears stated age Resp: clear to auscultation bilaterally and normal percussion bilaterally Cardio: regular rate and rhythm, S1, S2 normal, no murmur, click, rub or gallop and normal apical impulse GI: soft, non-tender; bowel sounds normal; no masses,  no organomegaly, normal findings: aorta normal, bowel sounds normal and liver span normal to percussion and incision: clean, dry and intact Extremities: extremities normal, atraumatic, no cyanosis or edema, Homans sign is negative, no sign of DVT and no edema, redness or tenderness in the calves or thighs Vaginal Bleeding: minimal  CBC    Component Value Date/Time   WBC 17.6* 04/29/2012 0510   RBC 4.32 04/29/2012 0510   HGB 11.4* 04/29/2012 0510   HCT 35.9* 04/29/2012 0510   PLT 321 04/29/2012 0510   MCV 83.1 04/29/2012 0510   MCH 26.4 04/29/2012 0510   MCHC 31.8 04/29/2012 0510   RDW 12.1 04/29/2012 0510      Assessment: s/p Procedure(s) (LRB) with comments: ROBOTIC ASSISTED TOTAL HYSTERECTOMY (N/A) BILATERAL SALPINGECTOMY (Bilateral): stable, progressing well and tolerating diet  Plan: Advance diet Encourage ambulation Advance to PO medication Discontinue IV fluids Discharge home  LOS: 1 day    Jabin Tapp J 04/29/2012, 6:53 AM

## 2013-04-25 ENCOUNTER — Ambulatory Visit: Payer: Self-pay | Admitting: Family Medicine

## 2016-05-18 ENCOUNTER — Ambulatory Visit
Admission: EM | Admit: 2016-05-18 | Discharge: 2016-05-18 | Disposition: A | Discharge status: Home / Self Care | Payer: Managed Care, Other (non HMO) | Attending: Family Medicine | Admitting: Family Medicine

## 2016-05-18 DIAGNOSIS — H6121 Impacted cerumen, right ear: Secondary | ICD-10-CM

## 2016-05-18 DIAGNOSIS — J0101 Acute recurrent maxillary sinusitis: Secondary | ICD-10-CM

## 2016-05-18 MED ORDER — AMOXICILLIN-POT CLAVULANATE 875-125 MG PO TABS: 1.0000 | ORAL_TABLET | Freq: Two times a day (BID) | ORAL | 0 refills | Status: AC

## 2016-05-18 NOTE — ED Provider Notes (Signed)
CSN: [id number]     Arrival date & time 05/18/16  1202 History   First MD Initiated Contact with Patient 05/18/16 1428     Chief Complaint  Patient presents with  . Recurrent Sinusitis   (Consider location/radiation/quality/duration/timing/severity/associated sxs/prior Treatment) HPI  This a 50 year old female states that for the last 5 days she's had pressure over her maxillary and frontal sinuses. She started as a cold last Wednesday. She has been using Flonase steadily and intermittently using an Netty pot. She has had a sinus surgery in the past because of numerous sinus infections that would never clear up despite multiple antibiotics. This is her first ice infection in over 4 years. His had no fever or chills.      History reviewed. No pertinent past medical history. Past Surgical History:  Procedure Laterality Date  . ENDOMETRIAL ABLATION    . NASAL SINUS SURGERY     Family History  Problem Relation Age of Onset  . Hypertension Mother   . Alzheimer's disease Father    Social History  Substance Use Topics  . Smoking status: Never Smoker  . Smokeless tobacco: Never Used  . Alcohol use No   OB History    No data available     Review of Systems  Constitutional: Positive for activity change. Negative for appetite change, chills, fatigue and fever.  HENT: Positive for congestion, postnasal drip, rhinorrhea, sinus pain, sinus pressure and sore throat.   Respiratory: Negative for cough, shortness of breath, wheezing and stridor.   All other systems reviewed and are negative.   Allergies  Patient has no known allergies.  Home Medications   Prior to Admission medications   Medication Sig Start Date End Date Taking? Authorizing Provider  fluticasone (FLONASE) 50 MCG/ACT nasal spray Place 2 sprays into the nose daily.   Yes Historical Provider, MD  amoxicillin-clavulanate (AUGMENTIN) 875-125 MG tablet Take 1 tablet by mouth every 12 (twelve) hours. 05/18/16   Lutricia FeilWilliam P  Eliav Mechling, PA-C   Meds Ordered and Administered this Visit  Medications - No data to display  BP 136/88 (BP Location: Left Arm)   Pulse 76   Temp 98.2 F (36.8 C) (Oral)   Resp 18   Ht 5\' 6"  (1.676 m)   Wt 195 lb (88.5 kg)   LMP 04/28/2012   SpO2 100%   BMI 31.47 kg/m  No data found.   Physical Exam  Constitutional: She is oriented to person, place, and time. She appears well-developed and well-nourished. No distress.  HENT:  Head: Normocephalic and atraumatic.  Left Ear: External ear normal.  Mouth/Throat: Oropharynx is clear and moist. No oropharyngeal exudate.  Right ear canal is occluded with cerumen. There is tenderness to percussion over the frontal and maxillary sinuses slightly worse on the right.  Eyes: EOM are normal. Pupils are equal, round, and reactive to light. Right eye exhibits no discharge. Left eye exhibits no discharge.  Neck: Normal range of motion. Neck supple.  Pulmonary/Chest: Effort normal and breath sounds normal. No respiratory distress. She has no wheezes. She has no rales.  Musculoskeletal: Normal range of motion.  Lymphadenopathy:    She has no cervical adenopathy.  Neurological: She is alert and oriented to person, place, and time.  Skin: Skin is warm and dry. She is not diaphoretic.  Psychiatric: She has a normal mood and affect. Her behavior is normal. Judgment and thought content normal.  Nursing note and vitals reviewed.   Urgent Care Course   Clinical Course  Procedures (including critical care time)  Labs Review Labs Reviewed - No data to display  Imaging Review No results found.   Visual Acuity Review  Right Eye Distance:   Left Eye Distance:   Bilateral Distance:    Right Eye Near:   Left Eye Near:    Bilateral Near:     Right ear was lavaged with clearing of the canal completely    MDM   1. Acute recurrent maxillary sinusitis    Discharge Medication List as of 05/18/2016  3:37 PM    START taking these  medications   Details  amoxicillin-clavulanate (AUGMENTIN) 875-125 MG tablet Take 1 tablet by mouth every 12 (twelve) hours., Starting Mon 05/18/2016, Normal      Plan: 1. Test/x-ray results and diagnosis reviewed with patient 2. rx as per orders; risks, benefits, potential side effects reviewed with patient 3. Recommend supportive treatment with use of Netty pot followed by Flonase. He states that the Zyrtec-D causes her to have anxiety and nervousness. I told her that the Zyrtec should not give their that problem and should consider switching to this. Follow-up with her primary care physician or an ENT 4. F/u prn if symptoms worsen or don't improve     Lutricia FeilWilliam P Keyen Marban, PA-C 05/18/16 1539    Lutricia FeilWilliam P Marili Vader, PA-C 05/18/16 1748

## 2016-05-18 NOTE — ED Triage Notes (Signed)
Pt states that she has a lot pressure for the about 5 days and she started out with a cold last wednesday

## 2017-11-05 ENCOUNTER — Ambulatory Visit (INDEPENDENT_AMBULATORY_CARE_PROVIDER_SITE_OTHER): Payer: Managed Care, Other (non HMO)

## 2017-11-05 ENCOUNTER — Ambulatory Visit (INDEPENDENT_AMBULATORY_CARE_PROVIDER_SITE_OTHER): Payer: Managed Care, Other (non HMO) | Admitting: Podiatry

## 2017-11-05 ENCOUNTER — Encounter: Payer: Self-pay | Admitting: Podiatry

## 2017-11-05 DIAGNOSIS — M779 Enthesopathy, unspecified: Secondary | ICD-10-CM

## 2017-11-05 DIAGNOSIS — M205X1 Other deformities of toe(s) (acquired), right foot: Secondary | ICD-10-CM

## 2017-11-05 MED ORDER — MELOXICAM 15 MG PO TABS: 15.0000 mg | ORAL_TABLET | Freq: Every day | ORAL | 0 refills | Status: DC

## 2017-11-05 NOTE — Progress Notes (Signed)
Subjective:    Patient ID: [name], female    DOB: 1965-07-24, 52 y.o.   MRN: [id number]  HPI 52 year old female presents the office today for concerns of pain to the right foot on the big toe and the joint itself.  She states that she has not had any recent injury or trauma but it feels like there is an injury.  She states that started after she was playing golf last weekend while in [facility].  She has noticed some swelling to the area.  She states that hurts to bend the toe.  She states that it feels like she hit it but does not recall doing this.  She said no recent treatment.  She has no other concerns.   Review of Systems  All other systems reviewed and are negative.  History reviewed. No pertinent past medical history.  Past Surgical History:  Procedure Laterality Date  . ENDOMETRIAL ABLATION    . NASAL SINUS SURGERY       Current Outpatient Medications:  .  amoxicillin-clavulanate (AUGMENTIN) 875-125 MG tablet, Take 1 tablet by mouth every 12 (twelve) hours. (Patient not taking: Reported on 11/05/2017), Disp: 20 tablet, Rfl: 0 .  fluticasone (FLONASE) 50 MCG/ACT nasal spray, Place 2 sprays into the nose daily., Disp: , Rfl:  .  meloxicam (MOBIC) 15 MG tablet, Take 1 tablet (15 mg total) by mouth daily., Disp: 30 tablet, Rfl: 0  No Known Allergies  Social History   Socioeconomic History  . Marital status: Married    Spouse name: Not on file  . Number of children: Not on file  . Years of education: Not on file  . Highest education level: Not on file  Occupational History  . Not on file  Social Needs  . Financial resource strain: Not on file  . Food insecurity:    Worry: Not on file    Inability: Not on file  . Transportation needs:    Medical: Not on file    Non-medical: Not on file  Tobacco Use  . Smoking status: Never Smoker  . Smokeless tobacco: Never Used  Substance and Sexual Activity  . Alcohol use: No  . Drug use: No  . Sexual activity: Not  on file  Lifestyle  . Physical activity:    Days per week: Not on file    Minutes per session: Not on file  . Stress: Not on file  Relationships  . Social connections:    Talks on phone: Not on file    Gets together: Not on file    Attends religious service: Not on file    Active member of club or organization: Not on file    Attends meetings of clubs or organizations: Not on file    Relationship status: Not on file  . Intimate partner violence:    Fear of current or ex partner: Not on file    Emotionally abused: Not on file    Physically abused: Not on file    Forced sexual activity: Not on file  Other Topics Concern  . Not on file  Social History Narrative  . Not on file       Objective:   Physical Exam  General: AAO x3, NAD  Dermatological: Skin is warm, dry and supple bilateral. Nails x 10 are well manicured; remaining integument appears unremarkable at this time. There are no open sores, no preulcerative lesions, no rash or signs of infection present.  Vascular: Dorsalis Pedis artery and  Posterior Tibial artery pedal pulses are 2/4 bilateral with immedate capillary fill time. Pedal hair growth present. No varicosities and no lower extremity edema present bilateral. There is no pain with calf compression, swelling, warmth, erythema.   Neruologic: Grossly intact via light touch bilateral. Vibratory intact via tuning fork bilateral. Protective threshold with Semmes Wienstein monofilament intact to all pedal sites bilateral.   Musculoskeletal: There is tenderness of the right foot on the first metatarsal phalangeal joint on the dorsal lateral portion as well as the IPJ.  There is edema to the first MPJ but there is no erythema or increase in warmth.  There is decreased range of motion of first MTPJ but there is no crepitation.  No first ray hypomobility is present.  There is no other area tenderness identified at this time.  Muscular strength 5/5 in all groups tested  bilateral.  Gait: Unassisted, Nonantalgic.      Assessment & Plan:  52 year old female right hallux limitus, capsulitis -Treatment options discussed including all alternatives, risks, and complications -Etiology of symptoms were discussed -X-rays were obtained and reviewed with the patient.  No definitive evidence of acute fracture or stress fracture is identified today.  Elongated first metatarsal and mild spurring present on the lateral aspect of the joint.  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  The skin was prepped with Betadine.  Next a mixture of 1 cc of Kenalog 10, 0.5 cc of 0.5% Marcaine plain and 0.5 cc of 2% lidocaine plain was infiltrated into and around the first MPJ without complications.  Postinjection care was discussed. -Prescribed mobic. Discussed side effects of the medication and directed to stop if any are to occur and call the office.  -Ice to the area daily -Discussed the change in shoes and wear a stiffer soled shoe. -Follow-up next couple weeks if symptoms continue or sooner if there is any issues.  Vivi BarrackMatthew R Kenton Fortin DPM

## 2017-11-26 ENCOUNTER — Ambulatory Visit (INDEPENDENT_AMBULATORY_CARE_PROVIDER_SITE_OTHER): Payer: Managed Care, Other (non HMO) | Admitting: Podiatry

## 2017-11-26 DIAGNOSIS — M779 Enthesopathy, unspecified: Secondary | ICD-10-CM

## 2017-11-26 DIAGNOSIS — M205X1 Other deformities of toe(s) (acquired), right foot: Secondary | ICD-10-CM | POA: Diagnosis not present

## 2017-11-29 NOTE — Progress Notes (Signed)
Subjective:  52 year old female presents the office today for follow-up evaluation of capsulitis and pain to her right fourth to the first MPJ.  She states that she is doing much better.  She is able to play 9 holes of golf yesterday without any pain.  Has had some occasional discomfort but overall her pain is much improved.  No recent injury no changes. Denies any systemic complaints such as fevers, chills, nausea, vomiting. No acute changes since last appointment, and no other complaints at this time.   Objective: AAO x3, NAD DP/PT pulses palpable bilaterally, CRT less than 3 seconds At this time there is no tenderness palpation of the first MTPJ there is no edema, erythema or increase in warmth.  There is no other areas of tenderness identified at this time.  Her symptoms appear to have resolved.  No open lesions or pre-ulcerative lesions.  No pain with calf compression, swelling, warmth, erythema  Assessment: Capsulitis right foot  Plan: -All treatment options discussed with the patient including all alternatives, risks, complications.  -As her symptoms have resolved.  I want her to continue with supportive shoes to help take pressure off the area.  If symptoms continue to future discussed a more custom orthotic.  Also discussed wearing a stiffer soled shoe. -Patient encouraged to call the office with any questions, concerns, change in symptoms.   Vivi Barrack DPM

## 2017-12-02 ENCOUNTER — Other Ambulatory Visit: Payer: Self-pay | Admitting: Podiatry

## 2018-01-10 ENCOUNTER — Other Ambulatory Visit: Payer: Self-pay | Admitting: Podiatry

## 2019-05-14 HISTORY — PX: BREAST BIOPSY: SHX20

## 2019-05-22 ENCOUNTER — Other Ambulatory Visit: Payer: Self-pay | Admitting: Obstetrics and Gynecology

## 2019-05-22 DIAGNOSIS — N6320 Unspecified lump in the left breast, unspecified quadrant: Secondary | ICD-10-CM

## 2019-05-25 ENCOUNTER — Other Ambulatory Visit: Payer: Self-pay

## 2019-05-25 ENCOUNTER — Other Ambulatory Visit: Payer: Self-pay | Admitting: Obstetrics and Gynecology

## 2019-05-25 ENCOUNTER — Ambulatory Visit
Admission: RE | Admit: 2019-05-25 | Discharge: 2019-05-25 | Disposition: A | Discharge status: Home / Self Care | Payer: Managed Care, Other (non HMO) | Source: Ambulatory Visit | Attending: Obstetrics and Gynecology | Admitting: Obstetrics and Gynecology

## 2019-05-25 DIAGNOSIS — N6320 Unspecified lump in the left breast, unspecified quadrant: Secondary | ICD-10-CM

## 2019-05-30 ENCOUNTER — Ambulatory Visit
Admission: RE | Admit: 2019-05-30 | Discharge: 2019-05-30 | Disposition: A | Discharge status: Home / Self Care | Payer: Managed Care, Other (non HMO) | Source: Ambulatory Visit | Attending: Obstetrics and Gynecology | Admitting: Obstetrics and Gynecology

## 2019-05-30 ENCOUNTER — Other Ambulatory Visit: Payer: Self-pay

## 2019-05-30 ENCOUNTER — Other Ambulatory Visit: Payer: Self-pay | Admitting: Obstetrics and Gynecology

## 2019-05-30 DIAGNOSIS — N6320 Unspecified lump in the left breast, unspecified quadrant: Secondary | ICD-10-CM

## 2019-06-19 ENCOUNTER — Other Ambulatory Visit: Payer: Self-pay

## 2019-06-19 DIAGNOSIS — Z20822 Contact with and (suspected) exposure to covid-19: Secondary | ICD-10-CM

## 2019-06-20 LAB — NOVEL CORONAVIRUS, NAA: SARS-CoV-2, NAA: NOT DETECTED

## 2020-03-06 ENCOUNTER — Other Ambulatory Visit: Payer: Self-pay | Admitting: Obstetrics and Gynecology

## 2020-03-06 DIAGNOSIS — N6320 Unspecified lump in the left breast, unspecified quadrant: Secondary | ICD-10-CM

## 2020-03-26 ENCOUNTER — Other Ambulatory Visit: Payer: Self-pay | Admitting: Obstetrics and Gynecology

## 2020-03-26 DIAGNOSIS — N6489 Other specified disorders of breast: Secondary | ICD-10-CM

## 2020-03-28 ENCOUNTER — Other Ambulatory Visit: Payer: Self-pay

## 2020-03-28 ENCOUNTER — Ambulatory Visit
Admission: RE | Admit: 2020-03-28 | Discharge: 2020-03-28 | Disposition: A | Discharge status: Home / Self Care | Payer: Managed Care, Other (non HMO) | Source: Ambulatory Visit | Attending: Obstetrics and Gynecology | Admitting: Obstetrics and Gynecology

## 2020-03-28 DIAGNOSIS — N6489 Other specified disorders of breast: Secondary | ICD-10-CM

## 2020-12-10 IMAGING — MG MM DIGITAL DIAGNOSTIC UNILAT*L* W/ TOMO W/ CAD
6 series · 6 of 18 positions shown · non-contrast
Comparison: Previous exam(s).

CLINICAL DATA: 53-year-old female for delayed six-month follow-up
(now 10 months) of biopsy of LEFT breast asymmetry demonstrating
fibrocystic changes.

EXAM:
DIGITAL DIAGNOSTIC UNILATERAL LEFT MAMMOGRAM WITH TOMO AND CAD

[L MLO synth-2D]
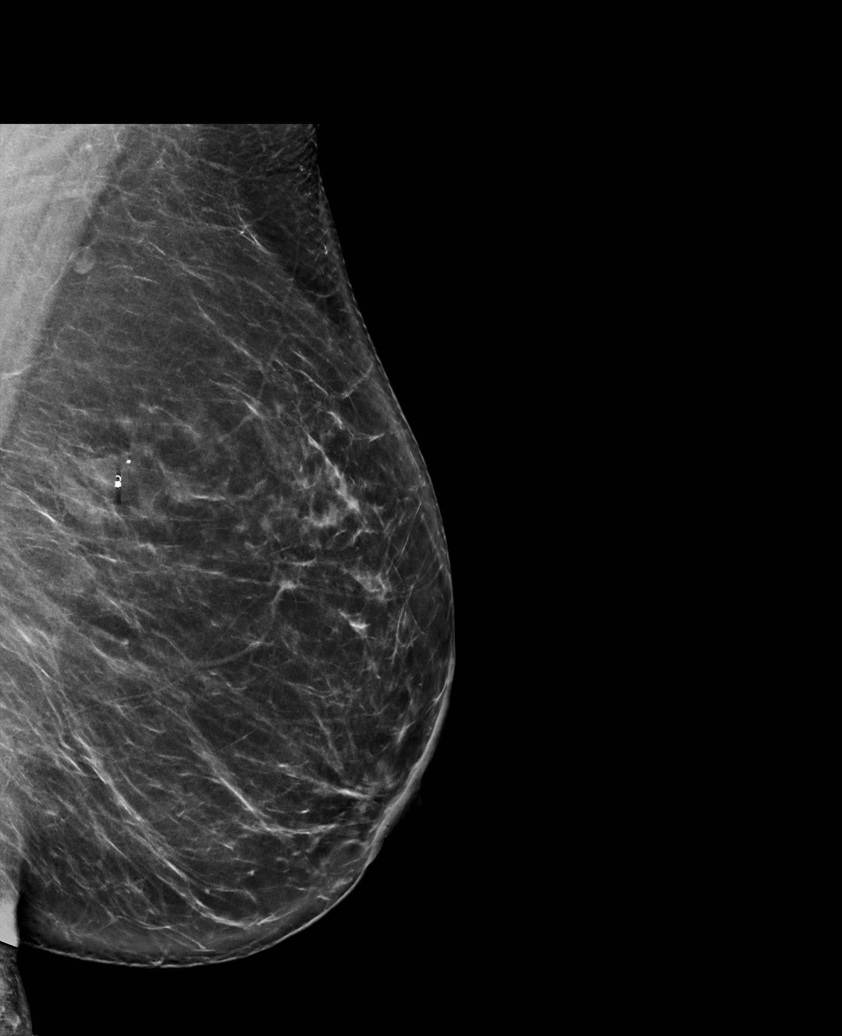

[L XCCL synth-2D]
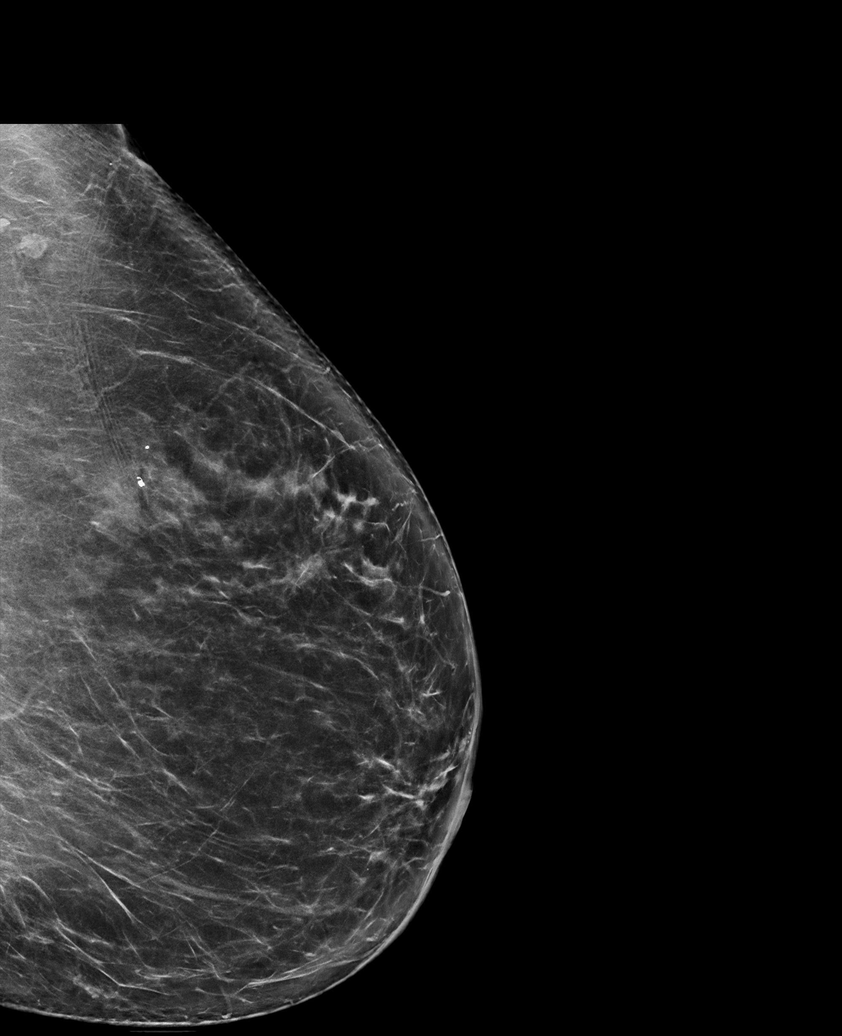

[L CC synth-2D]
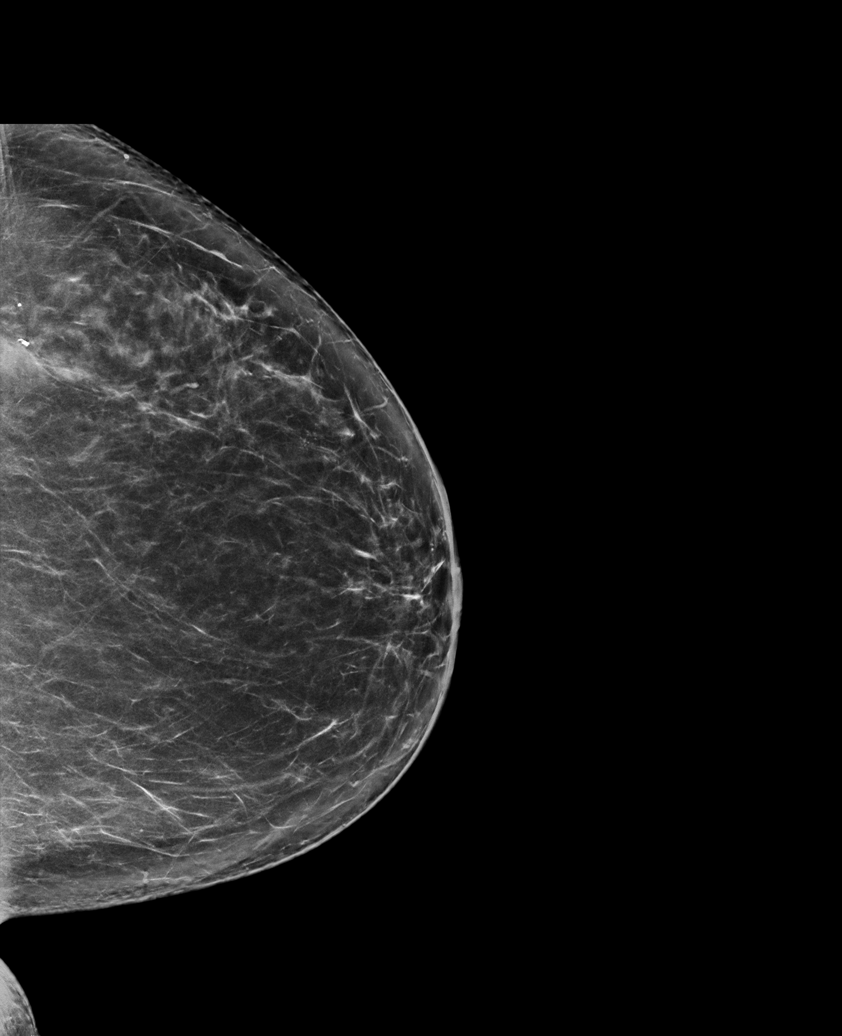

[L MLO tomo · tomo slice 43/86.0]
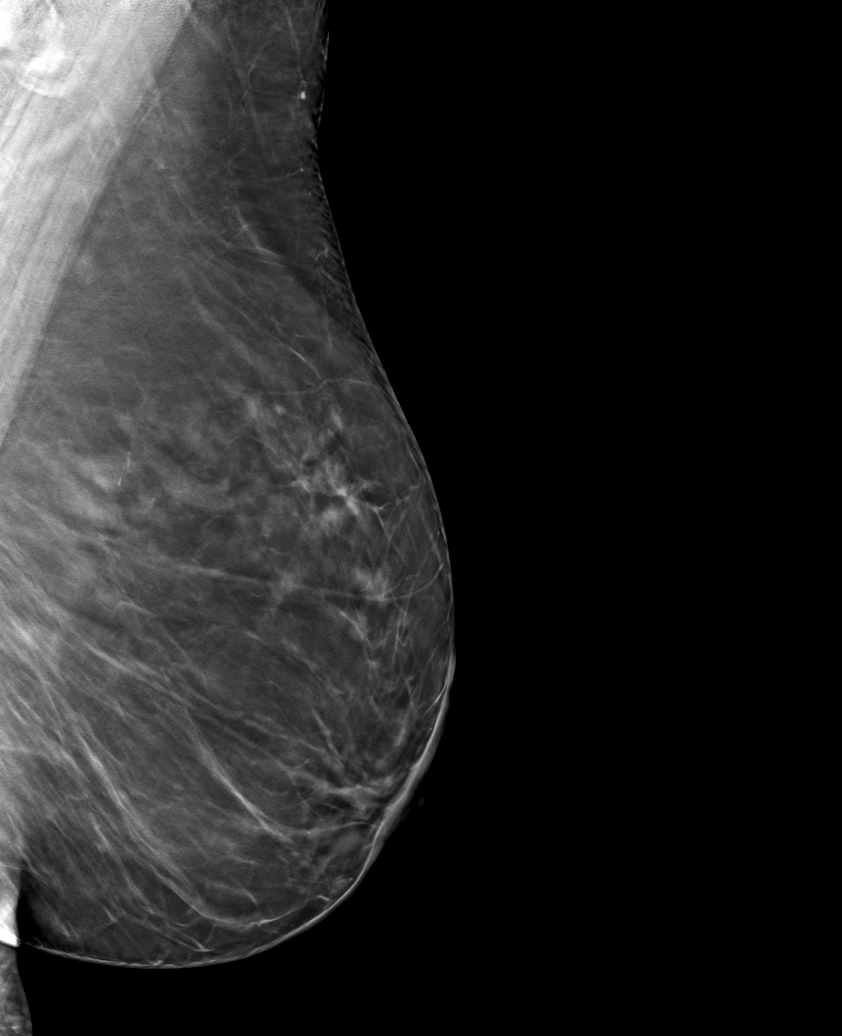

[L XCCL tomo · tomo slice 43/84.0]
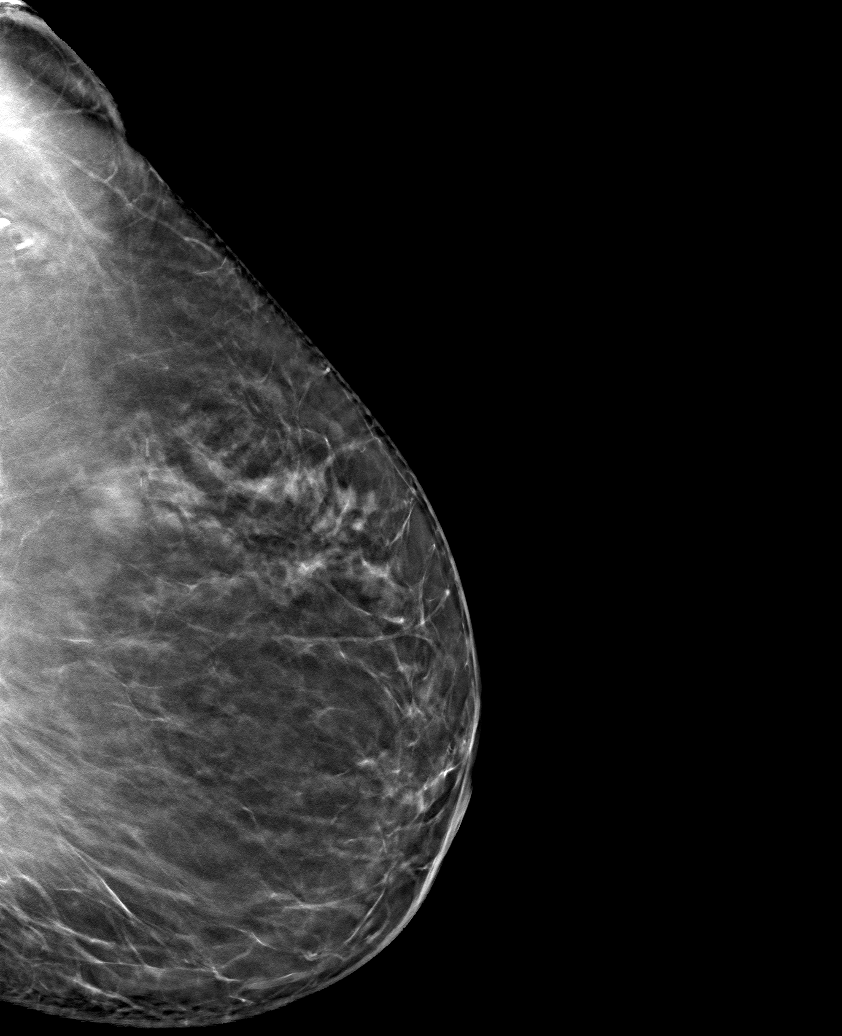

[L CC tomo · tomo slice 41/81.0]
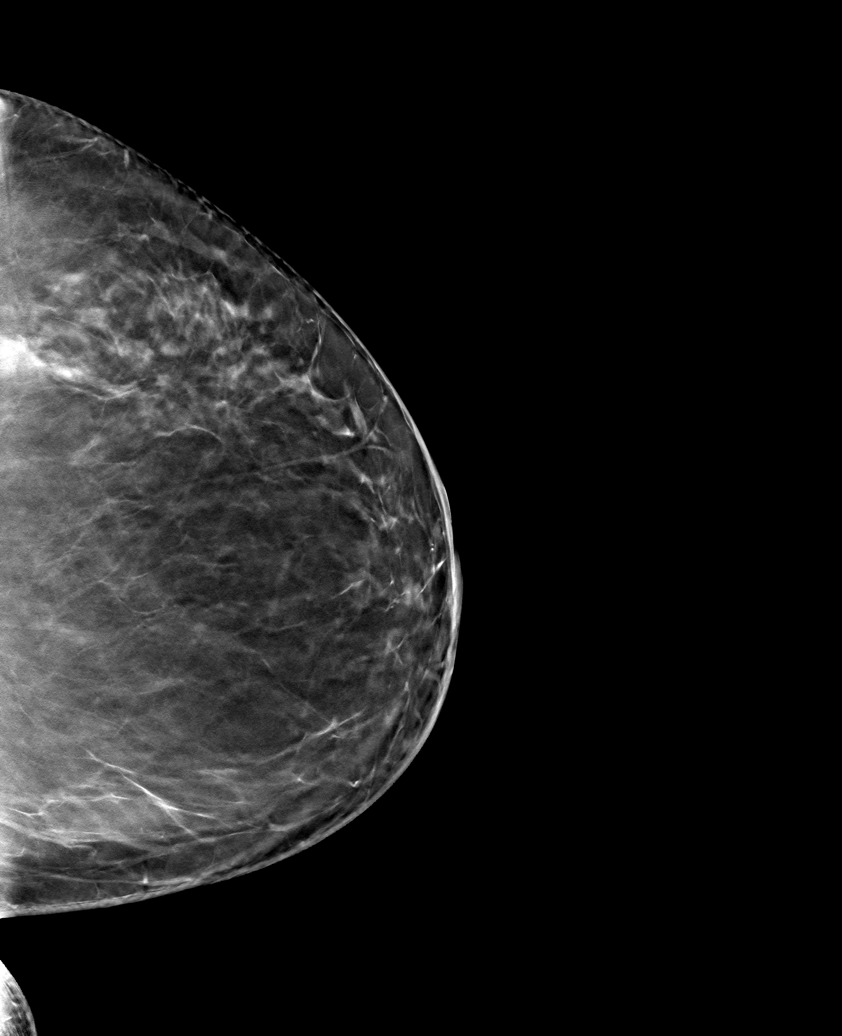

[6 of 18 positions shown; findings below may reference images not displayed]

ACR Breast Density Category b: There are scattered areas of
fibroglandular density.
FINDINGS: 2D/3D full field views of the LEFT breast demonstrate a slightly
less conspicuous asymmetry within the posterior UPPER-OUTER LEFT
breast with adjacent COIL biopsy clip.

No new or suspicious findings are noted.

Mammographic images were processed with CAD.
IMPRESSION: Slightly less conspicuous UPPER-OUTER LEFT breast asymmetry, biopsy
proven to be fibrocystic changes. No further imaging follow-up
recommended.

RECOMMENDATION:
Bilateral screening mammogram in 2 months to resume annual mammogram
schedule.

I have discussed the findings and recommendations with the patient.
If applicable, a reminder letter will be sent to the patient
regarding the next appointment.

BI-RADS CATEGORY  2: Benign.
